# Patient Record
Sex: Male | Born: 2000 | Hispanic: No | Marital: Single | Smoking: Never smoker
Health system: Southern US, Community
[De-identification: ages and names within clinical notes are randomized; demographics above are authoritative.]

## PROBLEM LIST (undated history)

## (undated) DIAGNOSIS — R7303 Prediabetes: Secondary | ICD-10-CM

## (undated) DIAGNOSIS — I10 Essential (primary) hypertension: Secondary | ICD-10-CM

---
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
## 2020-07-04 ENCOUNTER — Emergency Department (HOSPITAL_COMMUNITY): Payer: BC Managed Care – PPO

## 2020-07-04 ENCOUNTER — Emergency Department (HOSPITAL_COMMUNITY)
Admission: EM | Admit: 2020-07-04 | Discharge: 2020-07-04 | Disposition: A | Discharge status: Home / Self Care | Payer: BC Managed Care – PPO | Attending: Emergency Medicine | Admitting: Emergency Medicine

## 2020-07-04 ENCOUNTER — Encounter: Payer: Self-pay | Admitting: Emergency Medicine

## 2020-07-04 ENCOUNTER — Other Ambulatory Visit: Payer: Self-pay

## 2020-07-04 ENCOUNTER — Ambulatory Visit (INDEPENDENT_AMBULATORY_CARE_PROVIDER_SITE_OTHER)
Admission: EM | Admit: 2020-07-04 | Discharge: 2020-07-04 | Disposition: A | Discharge status: Home / Self Care | Payer: BC Managed Care – PPO | Source: Home / Self Care

## 2020-07-04 DIAGNOSIS — D72829 Elevated white blood cell count, unspecified: Secondary | ICD-10-CM | POA: Insufficient documentation

## 2020-07-04 DIAGNOSIS — R0789 Other chest pain: Secondary | ICD-10-CM | POA: Diagnosis not present

## 2020-07-04 DIAGNOSIS — J9811 Atelectasis: Secondary | ICD-10-CM | POA: Diagnosis not present

## 2020-07-04 DIAGNOSIS — R079 Chest pain, unspecified: Secondary | ICD-10-CM

## 2020-07-04 DIAGNOSIS — I10 Essential (primary) hypertension: Secondary | ICD-10-CM | POA: Diagnosis not present

## 2020-07-04 DIAGNOSIS — R0602 Shortness of breath: Secondary | ICD-10-CM | POA: Diagnosis not present

## 2020-07-04 DIAGNOSIS — R42 Dizziness and giddiness: Secondary | ICD-10-CM | POA: Insufficient documentation

## 2020-07-04 HISTORY — DX: Essential (primary) hypertension: I10

## 2020-07-04 HISTORY — DX: Prediabetes: R73.03

## 2020-07-04 LAB — BASIC METABOLIC PANEL
Anion gap: 7 (ref 5–15)
BUN: 14 mg/dL (ref 6–20)
CO2: 28 mmol/L (ref 22–32)
Calcium: 9.6 mg/dL (ref 8.9–10.3)
Chloride: 101 mmol/L (ref 98–111)
Creatinine, Ser: 0.72 mg/dL (ref 0.61–1.24)
GFR, Estimated: >60 mL/min (ref >60)
Glucose, Bld: 99 mg/dL (ref 70–99)
Potassium: 3.8 mmol/L (ref 3.5–5.1)
Sodium: 136 mmol/L (ref 135–145)

## 2020-07-04 LAB — CBC
HCT: 45.7 % (ref 39.0–52.0)
Hemoglobin: 14.7 g/dL (ref 13.0–17.0)
MCH: 28.2 pg (ref 26.0–34.0)
MCHC: 32.2 g/dL (ref 30.0–36.0)
MCV: 87.5 fL (ref 80.0–100.0)
Platelets: 275 10*3/uL (ref 150–400)
RBC: 5.22 MIL/uL (ref 4.22–5.81)
RDW: 12.4 % (ref 11.5–15.5)
WBC: 15.3 10*3/uL — ABNORMAL HIGH (ref 4.0–10.5)
nRBC: 0 % (ref 0.0–0.2)

## 2020-07-04 LAB — TROPONIN I (HIGH SENSITIVITY): Troponin I (High Sensitivity): 5 ng/L (ref <18)

## 2020-07-04 NOTE — Discharge Instructions (Addendum)
Follow-up with your primary care doctor.  Take Tylenol or Motrin for pain control.  If you have worsening chest pain, difficulty in breathing or other new concerning symptom I would recommend coming back to ER for reassessment.

## 2020-07-04 NOTE — ED Provider Notes (Signed)
Emergency Medicine Provider Triage Evaluation Note  Donald Dodson , a 20 y.o. male  was evaluated in triage.  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 Nausea without vomiting. CP left of sternum, does not radiate. History of prediabetes, HTN (ran out of BP meds 2-3 years ago).  Review of Systems  Positive: CP, SHOB Negative: vomiting  Physical Exam  There were no vitals taken for this visit. Gen:   Awake, no distress   Resp:  Normal effort  MSK:   Moves extremities without difficulty  Other:    Medical Decision Making  Medically screening exam initiated at 2:29 PM.  Appropriate orders placed.  Kimberly Altamirano-Garcia was informed that the remainder of the evaluation will be completed by another provider, this initial triage assessment does not replace that evaluation, and the importance of remaining in the ED until their evaluation is complete.     Jeannie Fend, PA-C 07/04/20 1431    Bethann Berkshire, MD 07/04/20 [phone]

## 2020-07-04 NOTE — ED Provider Notes (Signed)
[facility] EMERGENCY DEPARTMENT Provider Note   CSN: [id number] Arrival date & time: 07/04/20  1423     History Chief Complaint  Patient presents with  . Chest Pain    Donald Dodson is a 20 y.o. male.  Presents to ER with concern for episode of chest pain.  Patient reports that Saturday afternoon while at work he noted an episode of chest pain, lasted for couple hours and then resolved.  Had another episode yesterday and then again today.  Pain up to moderate in severity, central, nonradiating.  No pain at present.  Reports he has a history of prediabetes, hypertension but does not take any medication at present.  Non-smoker.  HPI     Past Medical History:  Diagnosis Date  . Hypertension   . Pre-diabetes   . Prediabetes     There are no problems to display for this patient.   No past surgical history on file.     Family History  Problem Relation Age of Onset  . Healthy Mother     Social History   Tobacco Use  . Smoking status: Never Smoker  . Smokeless tobacco: Never Used  Vaping Use  . Vaping Use: Never used  Substance Use Topics  . Alcohol use: Never  . Drug use: Never    Home Medications Prior to Admission medications   Medication Sig Start Date End Date Taking? Authorizing Provider  ibuprofen (ADVIL,MOTRIN) 600 MG tablet Take 1 tablet (600 mg total) by mouth every 6 (six) hours as needed. 07/30/16   Lowanda Foster, NP    Allergies    Patient has no known allergies.  Review of Systems   Review of Systems  Constitutional: Negative for chills and fever.  HENT: Negative for ear pain and sore throat.   Eyes: Negative for pain and visual disturbance.  Respiratory: Negative for cough and shortness of breath.   Cardiovascular: Positive for chest pain. Negative for palpitations.  Gastrointestinal: Negative for abdominal pain and vomiting.  Genitourinary: Negative for dysuria and hematuria.  Musculoskeletal: Negative for  arthralgias and back pain.  Skin: Negative for color change and rash.  Neurological: Negative for seizures and syncope.  All other systems reviewed and are negative.   Physical Exam Updated Vital Signs BP 109/60   Pulse 73   Temp 98.5 F (36.9 C) (Oral)   Resp (!) 26   Ht 5\' 8"  (1.727 m)   Wt 102.5 kg   SpO2 98%   BMI 34.36 kg/m   Physical Exam Vitals and nursing note reviewed.  Constitutional:      Appearance: He is well-developed.  HENT:     Head: Normocephalic and atraumatic.  Eyes:     Conjunctiva/sclera: Conjunctivae normal.  Cardiovascular:     Rate and Rhythm: Normal rate and regular rhythm.     Heart sounds: No murmur heard.   Pulmonary:     Effort: Pulmonary effort is normal. No respiratory distress.     Breath sounds: Normal breath sounds.  Abdominal:     Palpations: Abdomen is soft.     Tenderness: There is no abdominal tenderness.  Musculoskeletal:     Cervical back: Neck supple.  Skin:    General: Skin is warm and dry.  Neurological:     General: No focal deficit present.     Mental Status: He is alert.     ED Results / Procedures / Treatments   Labs (all labs ordered are listed, but only abnormal results are  displayed) Labs Reviewed  CBC - Abnormal; Notable for the following components:      Result Value   WBC 15.3 (*)    All other components within normal limits  BASIC METABOLIC PANEL  TROPONIN I (HIGH SENSITIVITY)  TROPONIN I (HIGH SENSITIVITY)    EKG EKG Interpretation  Date/Time:  Monday Jul 04 2020 14:39:55 EDT Ventricular Rate:  98 PR Interval:  150 QRS Duration: 98 QT Interval:  344 QTC Calculation: 439 R Axis:   51 Text Interpretation: Normal sinus rhythm Incomplete right bundle branch block Possible Anterior infarct , age undetermined Abnormal ECG Confirmed by Marianna Fuss ([id number]) on 07/04/2020 6:54:51 PM   Radiology DG Chest 2 View  Result Date: 07/04/2020 CLINICAL DATA:  Chest pain and shortness of breath. EXAM:  CHEST - 2 VIEW COMPARISON:  None. FINDINGS: The cardiomediastinal contours are normal. Subsegmental opacity at the left lung base. Pulmonary vasculature is normal. No confluent consolidation, pleural effusion, or pneumothorax. No acute osseous abnormalities are seen. IMPRESSION: Subsegmental opacity at the left lung base favors atelectasis. Electronically Signed   By: Narda Rutherford M.D.   On: 07/04/2020 15:16    Procedures Procedures   Medications Ordered in ED Medications - No data to display  ED Course  I have reviewed the triage vital signs and the nursing notes.  Pertinent labs & imaging results that were available during my care of the patient were reviewed by me and considered in my medical decision making (see chart for details).    MDM Rules/Calculators/A&P                         20 year old male presents to ER with concern for periodic episodes of chest pain.  On exam, well-appearing, no ongoing symptoms.  EKG without acute ischemic change, troponin within normal limits, doubt ACS.  CXR negative for acute process.  Basic labs are stable except mild leukocytosis. No fever, cough or other specific infectious symptoms.  Recommend follow-up with primary doctor, okay for discharge given reassuring work-up today.   After the discussed management above, the patient was determined to be safe for discharge.  The patient was in agreement with this plan and all questions regarding their care were answered.  ED return precautions were discussed and the patient will return to the ED with any significant worsening of condition.   Final Clinical Impression(s) / ED Diagnoses Final diagnoses:  Chest pain, unspecified type    Rx / DC Orders ED Discharge Orders    None       Milagros Loll, MD 07/05/20 2349

## 2020-07-04 NOTE — ED Triage Notes (Signed)
Complains of dizziness, hands were cold and numb on Saturday.  States this lasted about an hour.  Resolved after sitting down.  No further episodes.  Patient reports feeling light headed and jittery today. Pain in center chest "like someone put weight on it".

## 2020-07-04 NOTE — ED Notes (Signed)
Patient is being discharged from the Urgent Care and sent to the Emergency Department via POV . Per Wendee Beavers, NP, patient is in need of higher level of care due to chest pain complaint. Patient is aware and verbalizes understanding of plan of care.  Vitals:   07/04/20 1004  BP: 130/73  Pulse: 85  Resp: 20  Temp: 99.5 F (37.5 C)  SpO2: 98%

## 2020-07-04 NOTE — Discharge Instructions (Addendum)
Go to the emergency department for evaluation of your chest pain and lightheadedness.

## 2020-07-04 NOTE — ED Triage Notes (Signed)
Pt presents to the ED with chest pain and dizziness since Saturday. Pt denies radiation of chest pain. Hx of pre diabetes.

## 2020-07-04 NOTE — ED Provider Notes (Signed)
Donald Dodson    CSN: [id number] Arrival date & time: 07/04/20  0920      History   Chief Complaint Chief Complaint  Patient presents with  . Hypertension    HPI Donald Dodson is a 20 y.o. male.   Patient presents with chest pain and lightheadedness.  The chest pain started on 07/02/2020, is intermittent, feels like heaviness, like something is sitting on his chest.  The chest pain comes when he is feeling nervous or anxious.  He also reports an episode of dizziness and cold hands for 1 hour on 07/02/2020.  He denies diaphoresis, nausea, vomiting, shortness of breath, focal weakness, numbness, or other symptoms.  No treatments attempted at home.  He states he used to be on blood pressure medication but stopped taking it; he does not know the name.  His medical history includes hypertension and prediabetes.  The history is provided by the patient.    Past Medical History:  Diagnosis Date  . Hypertension   . Pre-diabetes   . Prediabetes     There are no problems to display for this patient.   History reviewed. No pertinent surgical history.     Home Medications    Prior to Admission medications   Medication Sig Start Date End Date Taking? Authorizing Provider  ibuprofen (ADVIL,MOTRIN) 600 MG tablet Take 1 tablet (600 mg total) by mouth every 6 (six) hours as needed. 07/30/16   Lowanda Foster, NP    Family History Family History  Problem Relation Age of Onset  . Healthy Mother     Social History Social History   Tobacco Use  . Smoking status: Never Smoker  . Smokeless tobacco: Never Used  Vaping Use  . Vaping Use: Never used  Substance Use Topics  . Alcohol use: Never  . Drug use: Never     Allergies   Patient has no known allergies.   Review of Systems Review of Systems  Constitutional: Negative for chills, diaphoresis and fever.  HENT: Negative for ear pain and sore throat.   Respiratory: Negative for cough and shortness of breath.    Cardiovascular: Positive for chest pain. Negative for palpitations.  Gastrointestinal: Negative for abdominal pain, nausea and vomiting.  Skin: Negative for color change and rash.  Neurological: Positive for light-headedness. Negative for dizziness, tremors, seizures, syncope, facial asymmetry, speech difficulty, weakness, numbness and headaches.  Psychiatric/Behavioral: The patient is nervous/anxious.   All other systems reviewed and are negative.    Physical Exam Triage Vital Signs ED Triage Vitals  Enc Vitals Group     BP      Pulse      Resp      Temp      Temp src      SpO2      Weight      Height      Head Circumference      Peak Flow      Pain Score      Pain Loc      Pain Edu?      Excl. in GC?    No data found.  Updated Vital Signs BP 130/73 (BP Location: Left Arm) Comment (BP Location): large cuff  Pulse 85   Temp 99.5 F (37.5 C) (Oral)   Resp 20   SpO2 98%   Visual Acuity Right Eye Distance:   Left Eye Distance:   Bilateral Distance:    Right Eye Near:   Left Eye Near:    Bilateral  Near:     Physical Exam Vitals and nursing note reviewed.  Constitutional:      General: He is not in acute distress.    Appearance: He is well-developed. He is obese. He is not ill-appearing.  HENT:     Head: Normocephalic and atraumatic.     Mouth/Throat:     Mouth: Mucous membranes are moist.  Eyes:     Conjunctiva/sclera: Conjunctivae normal.  Cardiovascular:     Rate and Rhythm: Normal rate and regular rhythm.     Heart sounds: Normal heart sounds.  Pulmonary:     Effort: Pulmonary effort is normal. No respiratory distress.     Breath sounds: Normal breath sounds.  Abdominal:     Palpations: Abdomen is soft.     Tenderness: There is no abdominal tenderness.  Musculoskeletal:        General: Normal range of motion.     Cervical back: Neck supple.     Right lower leg: No edema.     Left lower leg: No edema.  Skin:    General: Skin is warm and dry.   Neurological:     General: No focal deficit present.     Mental Status: He is alert and oriented to person, place, and time.     Sensory: No sensory deficit.     Motor: No weakness.     Gait: Gait normal.  Psychiatric:        Mood and Affect: Mood normal.        Behavior: Behavior normal.      UC Treatments / Results  Labs (all labs ordered are listed, but only abnormal results are displayed) Labs Reviewed - No data to display  EKG   Radiology No results found.  Procedures Procedures (including critical care time)  Medications Ordered in UC Medications - No data to display  Initial Impression / Assessment and Plan / UC Course  I have reviewed the triage vital signs and the nursing notes.  Pertinent labs & imaging results that were available during my care of the patient were reviewed by me and considered in my medical decision making (see chart for details).   Chest pain, lightheadedness.  EKG shows sinus rhythm, rate 62, no ST elevation, compared to previous from 2018.  Based on patient's symptoms, obesity, and history of hypertension, I am concerned for possible cardiovascular event.  Sending him to the ED for evaluation.  Patient declines EMS transport.  He states he is stable to go to the ED POV; his mother is with him.   Final Clinical Impressions(s) / UC Diagnoses   Final diagnoses:  Chest pain, unspecified type  Lightheadedness     Discharge Instructions     Go to the emergency department for evaluation of your chest pain and lightheadedness.        ED Prescriptions    None     PDMP not reviewed this encounter.   Mickie Bail, NP 07/04/20 1030

## 2021-01-14 ENCOUNTER — Telehealth: Payer: BC Managed Care – PPO | Admitting: Nurse Practitioner

## 2021-01-14 DIAGNOSIS — R6889 Other general symptoms and signs: Secondary | ICD-10-CM

## 2021-01-14 MED ORDER — OSELTAMIVIR PHOSPHATE 75 MG PO CAPS: 75.0000 mg | ORAL_CAPSULE | Freq: Two times a day (BID) | ORAL | 0 refills | Status: AC

## 2021-01-14 MED ORDER — IBUPROFEN 600 MG PO TABS: 600.0000 mg | ORAL_TABLET | Freq: Four times a day (QID) | ORAL | 0 refills | Status: DC | PRN

## 2021-01-14 NOTE — Progress Notes (Signed)
Virtual Visit Consent   Donald Dodson, you are scheduled for a virtual visit with a [facility] provider today.     Just as with appointments in the office, your consent must be obtained to participate.  Your consent will be active for this visit and any virtual visit you may have with one of our providers in the next 365 days.     If you have a MyChart account, a copy of this consent can be sent to you electronically.  All virtual visits are billed to your insurance company just like a traditional visit in the office.    As this is a virtual visit, video technology does not allow for your provider to perform a traditional examination.  This may limit your provider's ability to fully assess your condition.  If your provider identifies any concerns that need to be evaluated in person or the need to arrange testing (such as labs, EKG, etc.), we will make arrangements to do so.     Although advances in technology are sophisticated, we cannot ensure that it will always work on either your end or our end.  If the connection with a video visit is poor, the visit may have to be switched to a telephone visit.  With either a video or telephone visit, we are not always able to ensure that we have a secure connection.     I need to obtain your verbal consent now.   Are you willing to proceed with your visit today?    Donald Dodson has provided verbal consent on 01/14/2021 for a virtual visit (video or telephone).   Donald Rigg, NP   Date: 01/14/2021 11:38 AM   Virtual Visit via Video Note   I, Donald Dodson, connected with  Donald Dodson  ([id number], 06-Dec-2000) on 01/14/21 at 11:30 AM EST by a video-enabled telemedicine application and verified that I am speaking with the correct person using two identifiers.  Location: Patient: Virtual Visit Location Patient: Home Provider: Virtual Visit Location Provider: Home Office   I discussed the limitations of  evaluation and management by telemedicine and the availability of in person appointments. The patient expressed understanding and agreed to proceed.    History of Present Illness: Donald Dodson is a 20 y.o. who identifies as a male who was assigned male at birth, and is being seen today for FLU LIKE SYMPTOMS.  HPI:  Sister was diagnosed with the flu last week. He is currently experiencing symptoms of fatigue, body aches, sore throat, cold and congestion over the past 3 days. Taking tylenol for body aches with no relief of pain   Problems: There are no problems to display for this patient.   Allergies: No Known Allergies Medications:  Current Outpatient Medications:    oseltamivir (TAMIFLU) 75 MG capsule, Take 1 capsule (75 mg total) by mouth 2 (two) times daily for 5 days., Disp: 10 capsule, Rfl: 0   ibuprofen (ADVIL) 600 MG tablet, Take 1 tablet (600 mg total) by mouth every 6 (six) hours as needed., Disp: 60 tablet, Rfl: 0  Observations/Objective: Patient is well-developed, well-nourished in no acute distress.  Resting comfortably at home.  Head is normocephalic, atraumatic.  No labored breathing.  Speech is clear and coherent with logical content.  Patient is alert and oriented at baseline.    Assessment and Plan: 1. Flu-like symptoms - oseltamivir (TAMIFLU) 75 MG capsule; Take 1 capsule (75 mg total) by mouth 2 (two) times daily for 5 days.  Dispense:  10 capsule; Refill: 0 - ibuprofen (ADVIL) 600 MG tablet; Take 1 tablet (600 mg total) by mouth every 6 (six) hours as needed.  Dispense: 60 tablet; Refill: 0   Follow Up Instructions: I discussed the assessment and treatment plan with the patient. The patient was provided an opportunity to ask questions and all were answered. The patient agreed with the plan and demonstrated an understanding of the instructions.  A copy of instructions were sent to the patient via MyChart unless otherwise noted below.     The patient  was advised to call back or seek an in-person evaluation if the symptoms worsen or if the condition fails to improve as anticipated.  Time:  I spent 10 minutes with the patient via telehealth technology discussing the above problems/concerns.    Donald Rigg, NP

## 2021-01-14 NOTE — Patient Instructions (Signed)
  Donald Dodson, thank you for joining Donald Rigg, NP for today's virtual visit.  While this provider is not your primary care provider (PCP), if your PCP is located in our provider database this encounter information will be shared with them immediately following your visit.  Consent: (Patient) Donald Dodson provided verbal consent for this virtual visit at the beginning of the encounter.  Current Medications:  Current Outpatient Medications:    oseltamivir (TAMIFLU) 75 MG capsule, Take 1 capsule (75 mg total) by mouth 2 (two) times daily for 5 days., Disp: 10 capsule, Rfl: 0   ibuprofen (ADVIL) 600 MG tablet, Take 1 tablet (600 mg total) by mouth every 6 (six) hours as needed., Disp: 60 tablet, Rfl: 0   Medications ordered in this encounter:  Meds ordered this encounter  Medications   oseltamivir (TAMIFLU) 75 MG capsule    Sig: Take 1 capsule (75 mg total) by mouth 2 (two) times daily for 5 days.    Dispense:  10 capsule    Refill:  0    Order Specific Question:   Supervising Provider    Answer:   MILLER, BRIAN [[id number]]   ibuprofen (ADVIL) 600 MG tablet    Sig: Take 1 tablet (600 mg total) by mouth every 6 (six) hours as needed.    Dispense:  60 tablet    Refill:  0    Order Specific Question:   Supervising Provider    Answer:   Hyacinth Meeker, BRIAN [[id number]]     *If you need refills on other medications prior to your next appointment, please contact your pharmacy*  Follow-Up: Call back or seek an in-person evaluation if the symptoms worsen or if the condition fails to improve as anticipated.  Other Instructions Please keep well-hydrated and get plenty of rest. Start a saline nasal rinse to flush out your nasal passages. You can use plain Mucinex to help thin congestion. If you have a humidifier, running in the bedroom at night. I want you to start OTC vitamin D3 1000 units daily, vitamin C 1000 mg daily, and a zinc supplement. Please take prescribed  medications as directed.   If you note any worsening of symptoms, any significant shortness of breath or any chest pain, please seek ER evaluation ASAP.  Please do not delay care!    If you have been instructed to have an in-person evaluation today at a local Urgent Care facility, please use the link below. It will take you to a list of all of our available [facility], including address, phone number and hours of operation. Please do not delay care.  [facility]  If you or a family member do not have a primary care provider, use the link below to schedule a visit and establish care. When you choose a [facility] primary care physician or advanced practice provider, you gain a long-term partner in health. Find a Primary Care Provider  Learn more about [facility]'s in-office and virtual care options: [facility] - Get Care Now

## 2021-01-15 ENCOUNTER — Ambulatory Visit: Payer: BC Managed Care – PPO

## 2021-04-03 ENCOUNTER — Other Ambulatory Visit: Payer: Self-pay | Admitting: Family Medicine

## 2021-04-03 DIAGNOSIS — R1031 Right lower quadrant pain: Secondary | ICD-10-CM

## 2021-07-11 ENCOUNTER — Ambulatory Visit
Admission: RE | Admit: 2021-07-11 | Discharge: 2021-07-11 | Disposition: A | Discharge status: Home / Self Care | Payer: BC Managed Care – PPO | Source: Ambulatory Visit | Attending: Emergency Medicine | Admitting: Emergency Medicine

## 2021-07-11 VITALS — BP 112/70 | HR 67 | Temp 98.9°F | Resp 18

## 2021-07-11 DIAGNOSIS — R519 Headache, unspecified: Secondary | ICD-10-CM | POA: Insufficient documentation

## 2021-07-11 DIAGNOSIS — J358 Other chronic diseases of tonsils and adenoids: Secondary | ICD-10-CM | POA: Insufficient documentation

## 2021-07-11 DIAGNOSIS — J310 Chronic rhinitis: Secondary | ICD-10-CM | POA: Diagnosis not present

## 2021-07-11 DIAGNOSIS — H6691 Otitis media, unspecified, right ear: Secondary | ICD-10-CM | POA: Insufficient documentation

## 2021-07-11 DIAGNOSIS — J039 Acute tonsillitis, unspecified: Secondary | ICD-10-CM | POA: Diagnosis not present

## 2021-07-11 DIAGNOSIS — J329 Chronic sinusitis, unspecified: Secondary | ICD-10-CM | POA: Diagnosis not present

## 2021-07-11 LAB — POCT RAPID STREP A (OFFICE): Rapid Strep A Screen: NEGATIVE

## 2021-07-11 MED ORDER — CEFDINIR 300 MG PO CAPS: 300.0000 mg | ORAL_CAPSULE | Freq: Two times a day (BID) | ORAL | 0 refills | Status: AC

## 2021-07-11 MED ORDER — FLUTICASONE PROPIONATE 50 MCG/ACT NA SUSP: 1.0000 | Freq: Every day | NASAL | 1 refills | Status: AC

## 2021-07-11 MED ORDER — FEXOFENADINE HCL 180 MG PO TABS: 180.0000 mg | ORAL_TABLET | Freq: Every day | ORAL | 1 refills | Status: AC

## 2021-07-11 MED ORDER — IBUPROFEN 600 MG PO TABS: 600.0000 mg | ORAL_TABLET | Freq: Three times a day (TID) | ORAL | 0 refills | Status: AC | PRN

## 2021-07-11 MED ORDER — KETOROLAC TROMETHAMINE 60 MG/2ML IM SOLN
60.0000 mg | Freq: Once | INTRAMUSCULAR | Status: AC
  Administered 2021-07-11: 60 mg via INTRAMUSCULAR

## 2021-07-11 NOTE — Discharge Instructions (Addendum)
You were tested for COVID-19 today.  The result of your COVID-19 test will be posted to your MyChart once it is complete, typically this takes 36 to 48 hours.  ?  ?If your COVID-19 test is positive, please advise the nurse whether you are interested in taking the recommended antiviral for COVID-19 called Paxlovid. ?  ? ?Your strep test today is negative.  Streptococcal throat culture will be performed per our protocol, please keep in mind that the rapid strep test that we perform here at urgent care only catches 40% of strep throat infections. ?  ?Based on my physical exam findings which showed that your right tonsil is enlarged, has white patches on it, your right inner ear is red and there is purulent fluid behind your eardrum, I recommend that you begin antibiotics now for presumed strep throat and left ear infection instead of waiting for the strep culture result.  I have sent a prescription to your pharmacy.  After 24 hours of antibiotics, you should begin to feel significantly better.   ?  ?After 24 hours of taking antibiotics, please discard your toothbrush as well as any other oral devices that you are currently using and replace them with new ones to avoid reinfection. ?  ?If your streptococcal throat culture has a negative result but you feel significantly better after taking antibiotics for 24 to 48 hours, I strongly recommend that you finish the full 10-day course to treat the likely infection in your right ear.   ?  ?Once you have been on antibiotics for a full 24 hours, you are no longer considered contagious.   I have provided you with a note to return to work. ?  ?Even if you are feeling better, please make sure that you finish the full 10-day course and do not skip any doses.  Failure to complete a full course of antibiotics for strep throat can result in worsening infection that may require longer treatment with stronger antibiotics. ?  ?Omnicef (cefdinir):  1 capsule twice daily for 10 days, you  can take it with or without food.  This antibiotic can cause upset stomach, this will resolve once antibiotics are complete.  You are welcome to use a probiotic, eat yogurt, take Imodium while taking this medication.  Please avoid other systemic medications such as Maalox, Pepto-Bismol or milk of magnesia as they can interfere with your body's ability to absorb the antibiotics. ?  ?It is very important that you take antibiotics as prescribed.  If you skip doses or do not complete the full course of antibiotics, you put yourself at significant risk of recurrent infection which can often be worse than your initial infection. ?  ? ?For your headache, you received an injection of ketorolac during your visit today that should significantly relieve your pain.  I also sent a prescription for over-the-counter nonsteroidal anti-inflammatory pain medication for you to take should your headache symptoms return. ? ?Advil, Motrin (ibuprofen): This is a good anti-inflammatory medication which not only addresses aches, pains but also significantly reduces soft tissue inflammation of the upper airways that causes sinus and nasal congestion as well as inflammation of the lower airways which makes you feel like your breathing is constricted or your cough feel tight.  I recommend that you take between 400 mg every 8 hours as needed.    ?  ? ?Your symptoms and my physical exam findings are also concerning for exacerbation of your underlying allergies.  It is important that you  begin your allergy regimen now and are consistent with taking allergy medications exactly as prescribed.   ? ?Allegra (fexofenadine): This is an excellent second-generation antihistamine that helps to reduce respiratory inflammatory response to environmental allergens.  This medication is not known to cause daytime sleepiness so it can be taken in the daytime.  If you find that it does make you sleepy, please feel free to take it at bedtime. ?  ?Flonase  (fluticasone): This is a steroid nasal spray that you use once daily, 1 spray in each nare.  This medication does not work well if you decide to use it only used as you feel you need to, it works best used on a daily basis.  After 3 to 5 days of use, you will notice significant reduction of the inflammation and mucus production that is currently being caused by exposure to allergens, whether seasonal or environmental.  The most common side effect of this medication is nosebleeds.  If you experience a nosebleed, please discontinue use for 1 week, then feel free to resume.  I have provided you with a prescription.   ?If your insurance will not cover your allergy medications, please consider downloading the [employer] app which is free.  You can find considerable discounts on prescription and over-the-counter medications. ?  ?If you find that you have not had significant relief of your symptoms in the next 7 to 10 days, please follow-up with your primary care provider or return here to urgent care for repeat evaluation and further recommendations. ?  ?Thank you for visiting urgent care today.  We appreciate the opportunity to participate in your care. ?  ? ?

## 2021-07-11 NOTE — ED Provider Notes (Signed)
?[facility] ? ? ? ?CSN: [id number] ?Arrival date & time: 07/11/21  6384 ?  ? ?HISTORY  ? ?Chief Complaint  ?Patient presents with  ? Headache  ?  Entered by patient  ? ?HPI ?Donald Dodson is a 21 y.o. male. Patient complains of an intractable headache that is causing pain above and below both of his eyes and across his forehead to both temples that began 2 days ago.  Patient states he took Tylenol and ibuprofen with little relief.  Patient states he also took Alka-Seltzer which helped for a few hours.  Patient denies sore throat, otalgia, allergies, congestion, runny nose, fever, aches, chills.  Patient endorses upset stomach began yesterday and he also has not had any appetite since yesterday.  Patient states he has not vomited or had any diarrhea.  Patient denies known sick contacts.  Patient denies history of allergies, asthma, history of migraines. ? ?The history is provided by the patient.  ?Past Medical History:  ?Diagnosis Date  ? Hypertension   ? Pre-diabetes   ? Prediabetes   ? ?There are no problems to display for this patient. ? ?History reviewed. No pertinent surgical history. ? ?Home Medications   ? ?Prior to Admission medications   ?Medication Sig Start Date End Date Taking? Authorizing Provider  ?ibuprofen (ADVIL) 600 MG tablet Take 1 tablet (600 mg total) by mouth every 6 (six) hours as needed. 01/14/21   Claiborne Rigg, NP  ? ?Family History ?Family History  ?Problem Relation Age of Onset  ? Healthy Mother   ? ?Social History ?Social History  ? ?Tobacco Use  ? Smoking status: Never  ? Smokeless tobacco: Never  ?Vaping Use  ? Vaping Use: Never used  ?Substance Use Topics  ? Alcohol use: Never  ? Drug use: Never  ? ?Allergies   ?Patient has no known allergies. ? ?Review of Systems ?Review of Systems ?Pertinent findings noted in history of present illness.  ? ?Physical Exam ?Triage Vital Signs ?ED Triage Vitals  ?Enc Vitals Group  ?   BP 12/23/20 0827 (!) 147/82  ?   Pulse  Rate 12/23/20 0827 72  ?   Resp 12/23/20 0827 18  ?   Temp 12/23/20 0827 98.3 ?F (36.8 ?C)  ?   Temp Source 12/23/20 0827 Oral  ?   SpO2 12/23/20 0827 98 %  ?   Weight --   ?   Height --   ?   Head Circumference --   ?   Peak Flow --   ?   Pain Score 12/23/20 0826 5  ?   Pain Loc --   ?   Pain Edu? --   ?   Excl. in GC? --   ?No data found. ? ?Updated Vital Signs ?BP 112/70 (BP Location: Right Arm)   Pulse 67   Temp 98.9 ?F (37.2 ?C) (Oral)   Resp 18   SpO2 95%  ? ?Physical Exam ?Vitals and nursing note reviewed.  ?Constitutional:   ?   General: He is not in acute distress. ?   Appearance: Normal appearance. He is well-developed. He is ill-appearing. He is not toxic-appearing.  ?HENT:  ?   Head: Normocephalic and atraumatic.  ?   Salivary Glands: Right salivary gland is diffusely enlarged and tender. Left salivary gland is diffusely enlarged and tender.  ?   Right Ear: Hearing and external ear normal. No drainage. A middle ear effusion (Milky) is present. There is no impacted cerumen. Tympanic membrane  is injected, erythematous and bulging.  ?   Left Ear: Hearing, ear canal and external ear normal. No drainage. A middle ear effusion is present. There is no impacted cerumen. Tympanic membrane is bulging. Tympanic membrane is not injected or erythematous.  ?   Ears:  ?   Comments: Left EAC normal, left TM bulging with clear fluid.  Right EAC diffusely erythematous with mild edema, right TM erythematous and injected and bulging with milky fluid ?   Nose: Mucosal edema, congestion and rhinorrhea present. No nasal deformity, septal deviation, signs of injury or nasal tenderness. Rhinorrhea is clear.  ?   Right Nostril: Occlusion present. No foreign body, epistaxis or septal hematoma.  ?   Left Nostril: Occlusion present. No foreign body, epistaxis or septal hematoma.  ?   Right Turbinates: Enlarged, swollen and pale.  ?   Left Turbinates: Enlarged, swollen and pale.  ?   Right Sinus: No maxillary sinus tenderness or  frontal sinus tenderness.  ?   Left Sinus: No maxillary sinus tenderness or frontal sinus tenderness.  ?   Mouth/Throat:  ?   Lips: Pink. No lesions.  ?   Mouth: Mucous membranes are moist. No oral lesions or angioedema.  ?   Dentition: Normal dentition. No gingival swelling.  ?   Tongue: No lesions.  ?   Palate: No mass.  ?   Pharynx: Uvula midline. Pharyngeal swelling, oropharyngeal exudate and posterior oropharyngeal erythema present. No uvula swelling.  ?   Tonsils: Tonsillar exudate present. 2+ on the right. 4+ on the left.  ?   Comments: Postnasal drip.  Exudate on left tonsil only which is significantly more enlarged than the right. ?Eyes:  ?   General: Lids are normal.     ?   Right eye: No discharge.     ?   Left eye: No discharge.  ?   Extraocular Movements: Extraocular movements intact.  ?   Conjunctiva/sclera: Conjunctivae normal.  ?   Right eye: Right conjunctiva is not injected.  ?   Left eye: Left conjunctiva is not injected.  ?   Pupils: Pupils are equal, round, and reactive to light.  ?Neck:  ?   Thyroid: No thyroid mass, thyromegaly or thyroid tenderness.  ?   Trachea: Phonation normal. Tracheal tenderness present. No abnormal tracheal secretions or tracheal deviation.  ?   Comments: Voice is muffled ?Cardiovascular:  ?   Rate and Rhythm: Normal rate and regular rhythm.  ?   Pulses: Normal pulses.  ?   Heart sounds: Normal heart sounds, S1 normal and S2 normal. No murmur heard. ?  No friction rub. No gallop.  ?Pulmonary:  ?   Effort: Pulmonary effort is normal. No accessory muscle usage, prolonged expiration, respiratory distress or retractions.  ?   Breath sounds: Normal breath sounds. No stridor, decreased air movement or transmitted upper airway sounds. No decreased breath sounds, wheezing, rhonchi or rales.  ?Chest:  ?   Chest wall: No tenderness.  ?Abdominal:  ?   General: Bowel sounds are normal.  ?   Palpations: Abdomen is soft.  ?   Tenderness: There is generalized abdominal tenderness.  There is no right CVA tenderness, left CVA tenderness or rebound. Negative signs include Murphy's sign.  ?   Hernia: No hernia is present.  ?Musculoskeletal:     ?   General: No tenderness. Normal range of motion.  ?   Cervical back: Full passive range of motion without pain, normal range of motion  and neck supple. Normal range of motion.  ?   Right lower leg: No edema.  ?   Left lower leg: No edema.  ?Lymphadenopathy:  ?   Cervical: Cervical adenopathy present.  ?   Right cervical: Superficial cervical adenopathy and posterior cervical adenopathy present.  ?   Left cervical: Superficial cervical adenopathy and posterior cervical adenopathy present.  ?Skin: ?   General: Skin is warm and dry.  ?   Findings: No erythema, lesion or rash.  ?Neurological:  ?   General: No focal deficit present.  ?   Mental Status: He is alert and oriented to person, place, and time. Mental status is at baseline.  ?Psychiatric:     ?   Mood and Affect: Mood normal.     ?   Behavior: Behavior normal.     ?   Thought Content: Thought content normal.     ?   Judgment: Judgment normal.  ? ? ?Visual Acuity ?Right Eye Distance:   ?Left Eye Distance:   ?Bilateral Distance:   ? ?Right Eye Near:   ?Left Eye Near:    ?Bilateral Near:    ? ?UC Couse / Diagnostics / Procedures:  ?  ?EKG ? ?Radiology ?No results found. ? ?Procedures ?Procedures (including critical care time) ? ?UC Diagnoses / Final Clinical Impressions(s)   ?I have reviewed the triage vital signs and the nursing notes. ? ?Pertinent labs & imaging results that were available during my care of the patient were reviewed by me and considered in my medical decision making (see chart for details).   ?Final diagnoses:  ?Acute tonsillitis, unspecified etiology  ?Tonsillar exudate  ?Acute intractable headache, unspecified headache type  ?Infective otitis media, right  ?Rhinosinusitis  ? ?Rapid strep test today is negative.  Patient will be provided with antibiotics to treat the likely  infection in his right ear.  Throat culture pending.  COVID-19 test is pending, will treat with Paxlovid if positive. ? ?Patient provided with allergy medications and advised to take through June of this year.  Return

## 2021-07-11 NOTE — ED Triage Notes (Signed)
Pt c/o migraine that is causing facial pain, he states tylenol has not helped but the use of Alka seltzer was helpful for a short period of time. ?Started: Sunday night  ?

## 2021-07-12 LAB — NOVEL CORONAVIRUS, NAA: SARS-CoV-2, NAA: NOT DETECTED

## 2021-07-13 LAB — CULTURE, GROUP A STREP (THRC): Special Requests: NORMAL

## 2021-09-08 ENCOUNTER — Emergency Department (HOSPITAL_COMMUNITY): Payer: BC Managed Care – PPO

## 2021-09-08 ENCOUNTER — Encounter (HOSPITAL_COMMUNITY): Payer: Self-pay

## 2021-09-08 ENCOUNTER — Emergency Department (HOSPITAL_COMMUNITY)
Admission: EM | Admit: 2021-09-08 | Discharge: 2021-09-08 | Disposition: A | Discharge status: Home / Self Care | Payer: BC Managed Care – PPO | Attending: Emergency Medicine | Admitting: Emergency Medicine

## 2021-09-08 ENCOUNTER — Other Ambulatory Visit: Payer: Self-pay

## 2021-09-08 DIAGNOSIS — R0602 Shortness of breath: Secondary | ICD-10-CM | POA: Insufficient documentation

## 2021-09-08 DIAGNOSIS — R200 Anesthesia of skin: Secondary | ICD-10-CM | POA: Diagnosis not present

## 2021-09-08 DIAGNOSIS — R202 Paresthesia of skin: Secondary | ICD-10-CM | POA: Insufficient documentation

## 2021-09-08 DIAGNOSIS — R079 Chest pain, unspecified: Secondary | ICD-10-CM

## 2021-09-08 DIAGNOSIS — R0789 Other chest pain: Secondary | ICD-10-CM | POA: Insufficient documentation

## 2021-09-08 LAB — BASIC METABOLIC PANEL
Anion gap: 14 (ref 5–15)
BUN: 15 mg/dL (ref 6–20)
CO2: 25 mmol/L (ref 22–32)
Calcium: 9.4 mg/dL (ref 8.9–10.3)
Chloride: 103 mmol/L (ref 98–111)
Creatinine, Ser: 0.93 mg/dL (ref 0.61–1.24)
GFR, Estimated: >60 mL/min (ref >60)
Glucose, Bld: 128 mg/dL — ABNORMAL HIGH (ref 70–99)
Potassium: 3.7 mmol/L (ref 3.5–5.1)
Sodium: 142 mmol/L (ref 135–145)

## 2021-09-08 LAB — CBC
HCT: 42.7 % (ref 39.0–52.0)
Hemoglobin: 14.2 g/dL (ref 13.0–17.0)
MCH: 29.2 pg (ref 26.0–34.0)
MCHC: 33.3 g/dL (ref 30.0–36.0)
MCV: 87.7 fL (ref 80.0–100.0)
Platelets: 217 10*3/uL (ref 150–400)
RBC: 4.87 MIL/uL (ref 4.22–5.81)
RDW: 12.5 % (ref 11.5–15.5)
WBC: 8.5 10*3/uL (ref 4.0–10.5)
nRBC: 0 % (ref 0.0–0.2)

## 2021-09-08 LAB — TROPONIN I (HIGH SENSITIVITY)
Troponin I (High Sensitivity): 7 ng/L (ref <18)
Troponin I (High Sensitivity): 7 ng/L (ref <18)

## 2021-09-08 NOTE — ED Provider Notes (Signed)
[facility] EMERGENCY DEPARTMENT Provider Note   CSN: [id number] Arrival date & time: 09/08/21  0855     History PMH: HTN, Pre diabetes Chief Complaint  Patient presents with   arm numbness    Donald Dodson is a 21 y.o. male. Pt complains of right arm numbness.  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  He says he suddenly started noticing some middle of his chest discomfort with some mild shortness of breath, and associated right arm numbness from the shoulder down to his hands.  His symptoms have started to improve, but have not completely gone away.  He says he has some associated tingling in his right upper extremity. He has no neck pain, shoulder pain, headaches, visual disturbance, speech change, motor weakness, gait abnormality, leg swelling.  HPI     Home Medications Prior to Admission medications   Medication Sig Start Date End Date Taking? Authorizing Provider  fexofenadine (ALLEGRA) 180 MG tablet Take 1 tablet (180 mg total) by mouth daily. 07/11/21 01/07/22  Theadora Rama Scales, PA-C  fluticasone (FLONASE) 50 MCG/ACT nasal spray Place 1 spray into both nostrils daily. Begin by using 2 sprays in each nare daily for 3 to 5 days, then decrease to 1 spray in each nare daily. 07/11/21   Theadora Rama Scales, PA-C  ibuprofen (ADVIL) 600 MG tablet Take 1 tablet (600 mg total) by mouth every 8 (eight) hours as needed for up to 30 doses for fever, headache, mild pain or moderate pain (Inflammation). Take 1 tablet 3 times daily as needed for inflammation of upper airways and/or pain. 07/11/21   Theadora Rama Scales, PA-C      Allergies    Patient has no known allergies.    Review of Systems   Review of Systems  Respiratory:  Positive for shortness of breath.   Cardiovascular:  Positive for chest pain.  Neurological:  Positive for numbness.   All other systems reviewed and are negative.   Physical Exam Updated Vital Signs BP 112/66 (BP Location: Right Arm)   Pulse 64   Temp 98.3 F (36.8 C) (Oral)   Resp 15   Ht 5\' 7"  (1.702 m)   Wt 99.8 kg   SpO2 100%   BMI 34.46 kg/m  Physical Exam Vitals and nursing note reviewed.  Constitutional:      General: He is not in acute distress.    Appearance: Normal appearance. He is well-developed. He is not ill-appearing, toxic-appearing or diaphoretic.  HENT:     Head: Normocephalic and atraumatic.     Nose: No nasal deformity.     Mouth/Throat:     Lips: Pink. No lesions.     Mouth: Mucous membranes are moist. No injury, lacerations, oral lesions or angioedema.     Pharynx: Oropharynx is clear. Uvula midline. No pharyngeal swelling, oropharyngeal exudate, posterior oropharyngeal erythema or uvula swelling.  Eyes:     General: Gaze aligned appropriately. No scleral icterus.       Right eye: No discharge.        Left eye: No discharge.     Conjunctiva/sclera: Conjunctivae normal.     Right eye: Right conjunctiva is not injected. No exudate or hemorrhage.    Left eye: Left conjunctiva is not injected. No exudate or hemorrhage.    Pupils: Pupils are equal, round, and reactive to light.  Cardiovascular:  Rate and Rhythm: Normal rate and regular rhythm.     Pulses: Normal pulses.          Radial pulses are 2+ on the right side and 2+ on the left side.       Dorsalis pedis pulses are 2+ on the right side and 2+ on the left side.     Heart sounds: Normal heart sounds, S1 normal and S2 normal. Heart sounds not distant. No murmur heard.    No friction rub. No gallop. No S3 or S4 sounds.  Pulmonary:     Effort: Pulmonary effort is normal. No accessory muscle usage or respiratory distress.     Breath sounds: Normal breath sounds. No stridor. No wheezing, rhonchi or rales.  Chest:     Chest wall: No tenderness.  Abdominal:     General: Abdomen is flat. There is no distension.      Palpations: Abdomen is soft. There is no mass or pulsatile mass.     Tenderness: There is no abdominal tenderness. There is no guarding or rebound.  Musculoskeletal:     Right lower leg: No edema.     Left lower leg: No edema.     Comments: No reproducible shoulder or right arm pain, no reproducible chest wall pain 2+ radial pulse bilaterally. Subjective right arm decreased sensation  Skin:    General: Skin is warm and dry.     Coloration: Skin is not jaundiced or pale.     Findings: No bruising, erythema, lesion or rash.  Neurological:     General: No focal deficit present.     Mental Status: He is alert and oriented to person, place, and time.     GCS: GCS eye subscore is 4. GCS verbal subscore is 5. GCS motor subscore is 6.  Psychiatric:        Mood and Affect: Mood normal.        Speech: Speech normal.        Behavior: Behavior normal. Behavior is cooperative.     ED Results / Procedures / Treatments   Labs (all labs ordered are listed, but only abnormal results are displayed) Labs Reviewed  BASIC METABOLIC PANEL - Abnormal; Notable for the following components:      Result Value   Glucose, Bld 128 (*)    All other components within normal limits  CBC  TROPONIN I (HIGH SENSITIVITY)  TROPONIN I (HIGH SENSITIVITY)    EKG EKG Interpretation  Date/Time:  Friday September 08 2021 09:05:52 EDT Ventricular Rate:  75 PR Interval:  162 QRS Duration: 106 QT Interval:  368 QTC Calculation: 410 R Axis:   54 Text Interpretation: Sinus rhythm with marked sinus arrhythmia Otherwise normal ECG When compared with ECG of 04-Jul-2020 14:39, No significant change since last tracing Confirmed by Meridee Score [phone]) on 09/08/2021 12:54:20 PM  Radiology DG Chest 2 View  Result Date: 09/08/2021 CLINICAL DATA:  Chest pain EXAM: CHEST - 2 VIEW COMPARISON:  07/04/2020 FINDINGS: The lungs appear clear. Cardiac and mediastinal contours normal. No pleural effusion identified. IMPRESSION: No  active cardiopulmonary disease is radiographically apparent. Electronically Signed   By: Gaylyn Rong M.D.   On: 09/08/2021 09:54    Procedures Procedures  This patient was on telemetry or cardiac monitoring during their time in the ED.    Medications Ordered in ED Medications - No data to display  ED Course/ Medical Decision Making/ A&P Clinical Course as of 09/08/21 1513  Fri Sep 08, 2021  1355 Dispo pending repeat troponin [GL]    Clinical Course User Index [GL] Pluma Diniz, Finis Bud, PA-C                           Medical Decision Making Amount and/or Complexity of Data Reviewed Labs: ordered. Radiology: ordered.    MDM  This is a 21 y.o. male who presents to the ED with chest pain and left arm paresthesias The differential of this patient includes but is not limited to ACS, Aortic Dissection, PE, Pinched nerve, etc.  Initial Impression  Well appearing. Stable vitals Exam reassuring. Concern for ACS given family history of sudden death. Will get cardiac workup. Low suspicion for dissection with improvement of symptoms. PE low risk (PERC negative).   I personally ordered, reviewed, and interpreted all laboratory work and imaging and agree with radiologist interpretation. Results interpreted below: CBC normal.  BMP normal.  Troponin negative x2.  Chest x-ray without any acute abnormalities.  EKG is nonischemic and normal sinus rhythm/SA.  Assessment/Plan:  On reassessment, patient's symptoms have resolved.  He had 2 negative troponin markers.  I have low suspicion this is a cardiac etiology to his symptoms.  Suspect it is more musculoskeletal.  Patient has follow-up with his PCP in 3 days.  Recommend that he go to this appointment.  Return precautions discussed with patient.   Charting Requirements Additional history is obtained from:  Independent historian External Records from outside source obtained and reviewed including: n/a Social Determinants of Health:   none Pertinant PMH that complicates patient's illness: n/a  Patient Care Problems that were addressed during this visit: - Chest Pain: Acute illness - Paresthesias: Acute illness with systemic symptoms This patient was maintained on a cardiac monitor/telemetry. I personally viewed and interpreted the cardiac monitor which reveals an underlying rhythm of NSR Medications given in ED: n/a Reevaluation of the patient after these medicines showed that the patient resolved I have reviewed home medications and made changes accordingly.  Critical Care Interventions: n/a Consultations: n/a Disposition: discharge  Portions of this note were generated with Dragon dictation software. Dictation errors may occur despite best attempts at proofreading.    Final Clinical Impression(s) / ED Diagnoses Final diagnoses:  Chest pain, unspecified type  Paresthesias    Rx / DC Orders ED Discharge Orders     None         Claudie Leach, PA-C 09/08/21 1513    Terrilee Files, MD 09/08/21 [phone]

## 2021-09-08 NOTE — Discharge Instructions (Signed)
Please schedule a follow up appointment with your PCP to discuss further management of your symptoms.

## 2021-09-08 NOTE — ED Provider Triage Note (Signed)
Emergency Medicine Provider Triage Evaluation Note  Donald Dodson , a 21 y.o. male  was evaluated in triage.  Pt complains of right arm numbness.  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  He says he suddenly started noticing some middle of his chest discomfort with some mild shortness of breath, and associated right arm numbness from the shoulder down to his hands.  His symptoms have started to improve, but have not completely gone away.  He says he has some associated tingling in his right upper extremity. He has no neck pain, shoulder pain, headaches, visual disturbance, speech change, motor weakness, gait abnormality, leg swelling.  He does note that his father died suddenly from a possible heart condition when he was age 21 while at a party where he collapsed.  It is unknown what happened.  Review of Systems  Positive: Chest pain, shortness of breath, numbness Negative:   Physical Exam  BP (!) 152/87   Pulse 63   Temp 98.2 F (36.8 C) (Oral)   Resp 18   Ht 5\' 7"  (1.702 m)   Wt 99.8 kg   SpO2 100%   BMI 34.46 kg/m  Gen:   Awake, no distress   Resp:  Normal effort  MSK:   Moves extremities without difficulty  Other:  5 out of 5 motor strength in all extremities.  There is decreased right upper extremity sensation up until the shoulder.  No cervical spine tenderness.  Abdomen is soft and nontender.  Heart regular rate and rhythm.  Lungs CTA. He has round equal and reactive pupils.  EOM intact. Gait intact.  Medical Decision Making  Medically screening exam initiated at 9:26 AM.  Appropriate orders placed.  Donald Dodson was informed that the remainder of the evaluation will be completed by another provider, this initial triage assessment does not replace that evaluation, and the importance of remaining in the ED until their evaluation is complete.  Cardiac  Workup initiated.    Christiane Ha, PA-C 09/08/21 0930

## 2021-09-08 NOTE — ED Triage Notes (Signed)
Pt states he was at work and right arm started tingling and feeling numb.  Pt denies pain or injury.  Pt mounts lenses on glasses for work.

## 2021-10-16 DIAGNOSIS — R200 Anesthesia of skin: Secondary | ICD-10-CM | POA: Diagnosis not present

## 2021-10-16 DIAGNOSIS — F4322 Adjustment disorder with anxiety: Secondary | ICD-10-CM | POA: Diagnosis not present

## 2021-12-07 DIAGNOSIS — F39 Unspecified mood [affective] disorder: Secondary | ICD-10-CM | POA: Diagnosis not present

## 2021-12-07 DIAGNOSIS — Z0131 Encounter for examination of blood pressure with abnormal findings: Secondary | ICD-10-CM | POA: Diagnosis not present

## 2021-12-07 DIAGNOSIS — I10 Essential (primary) hypertension: Secondary | ICD-10-CM | POA: Diagnosis not present

## 2021-12-07 DIAGNOSIS — Z0130 Encounter for examination of blood pressure without abnormal findings: Secondary | ICD-10-CM | POA: Diagnosis not present

## 2021-12-07 DIAGNOSIS — F4322 Adjustment disorder with anxiety: Secondary | ICD-10-CM | POA: Diagnosis not present

## 2021-12-07 DIAGNOSIS — Z1389 Encounter for screening for other disorder: Secondary | ICD-10-CM | POA: Diagnosis not present

## 2021-12-07 DIAGNOSIS — R5383 Other fatigue: Secondary | ICD-10-CM | POA: Diagnosis not present

## 2022-01-27 DIAGNOSIS — Z8489 Family history of other specified conditions: Secondary | ICD-10-CM | POA: Diagnosis not present

## 2022-01-27 DIAGNOSIS — F1090 Alcohol use, unspecified, uncomplicated: Secondary | ICD-10-CM | POA: Diagnosis not present

## 2022-01-27 DIAGNOSIS — R079 Chest pain, unspecified: Secondary | ICD-10-CM | POA: Diagnosis not present

## 2022-01-27 DIAGNOSIS — I10 Essential (primary) hypertension: Secondary | ICD-10-CM | POA: Diagnosis not present

## 2022-01-27 DIAGNOSIS — K219 Gastro-esophageal reflux disease without esophagitis: Secondary | ICD-10-CM | POA: Diagnosis not present

## 2022-01-27 DIAGNOSIS — G5603 Carpal tunnel syndrome, bilateral upper limbs: Secondary | ICD-10-CM | POA: Diagnosis not present

## 2022-01-27 DIAGNOSIS — R0602 Shortness of breath: Secondary | ICD-10-CM | POA: Diagnosis not present

## 2022-01-27 DIAGNOSIS — R42 Dizziness and giddiness: Secondary | ICD-10-CM | POA: Diagnosis not present

## 2022-01-27 DIAGNOSIS — R0789 Other chest pain: Secondary | ICD-10-CM | POA: Diagnosis not present

## 2022-01-30 DIAGNOSIS — R079 Chest pain, unspecified: Secondary | ICD-10-CM | POA: Diagnosis not present

## 2022-02-08 DIAGNOSIS — Z23 Encounter for immunization: Secondary | ICD-10-CM | POA: Diagnosis not present

## 2022-02-08 DIAGNOSIS — R7309 Other abnormal glucose: Secondary | ICD-10-CM | POA: Diagnosis not present

## 2022-02-08 DIAGNOSIS — R0789 Other chest pain: Secondary | ICD-10-CM | POA: Diagnosis not present

## 2022-02-08 DIAGNOSIS — Z1389 Encounter for screening for other disorder: Secondary | ICD-10-CM | POA: Diagnosis not present

## 2022-02-08 DIAGNOSIS — E781 Pure hyperglyceridemia: Secondary | ICD-10-CM | POA: Diagnosis not present

## 2022-02-08 DIAGNOSIS — F411 Generalized anxiety disorder: Secondary | ICD-10-CM | POA: Diagnosis not present

## 2022-02-08 DIAGNOSIS — I10 Essential (primary) hypertension: Secondary | ICD-10-CM | POA: Diagnosis not present

## 2022-02-08 DIAGNOSIS — Z8241 Family history of sudden cardiac death: Secondary | ICD-10-CM | POA: Diagnosis not present

## 2022-02-08 DIAGNOSIS — E782 Mixed hyperlipidemia: Secondary | ICD-10-CM | POA: Diagnosis not present

## 2022-02-08 DIAGNOSIS — F5101 Primary insomnia: Secondary | ICD-10-CM | POA: Diagnosis not present

## 2022-04-04 DIAGNOSIS — Z8241 Family history of sudden cardiac death: Secondary | ICD-10-CM | POA: Diagnosis not present

## 2022-04-04 DIAGNOSIS — R0789 Other chest pain: Secondary | ICD-10-CM | POA: Diagnosis not present

## 2022-05-12 IMAGING — DX DG CHEST 2V
2 series · 2 of 2 positions shown · non-contrast
Comparison: None.

CLINICAL DATA: Chest pain and shortness of breath.

EXAM:
CHEST - 2 VIEW

[chest pa]
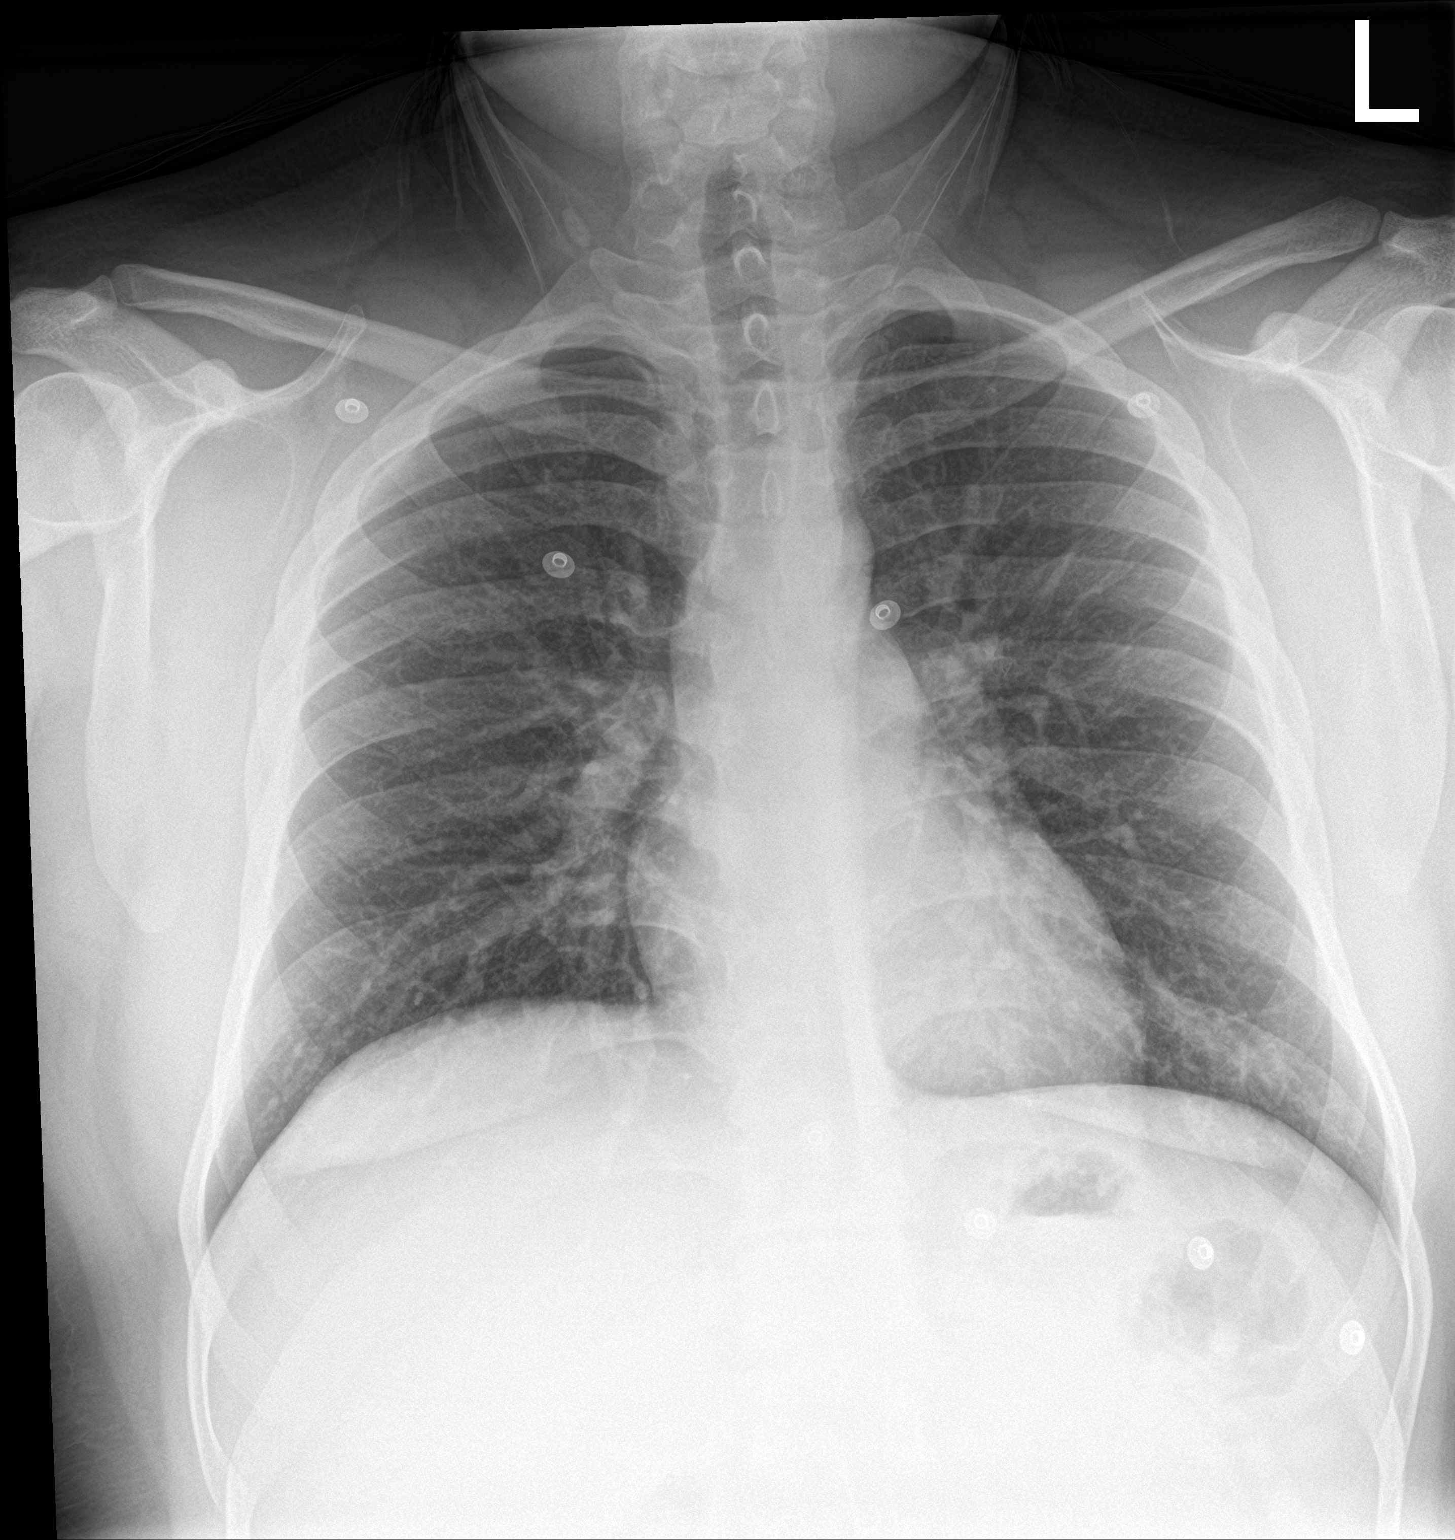

[chest lat]
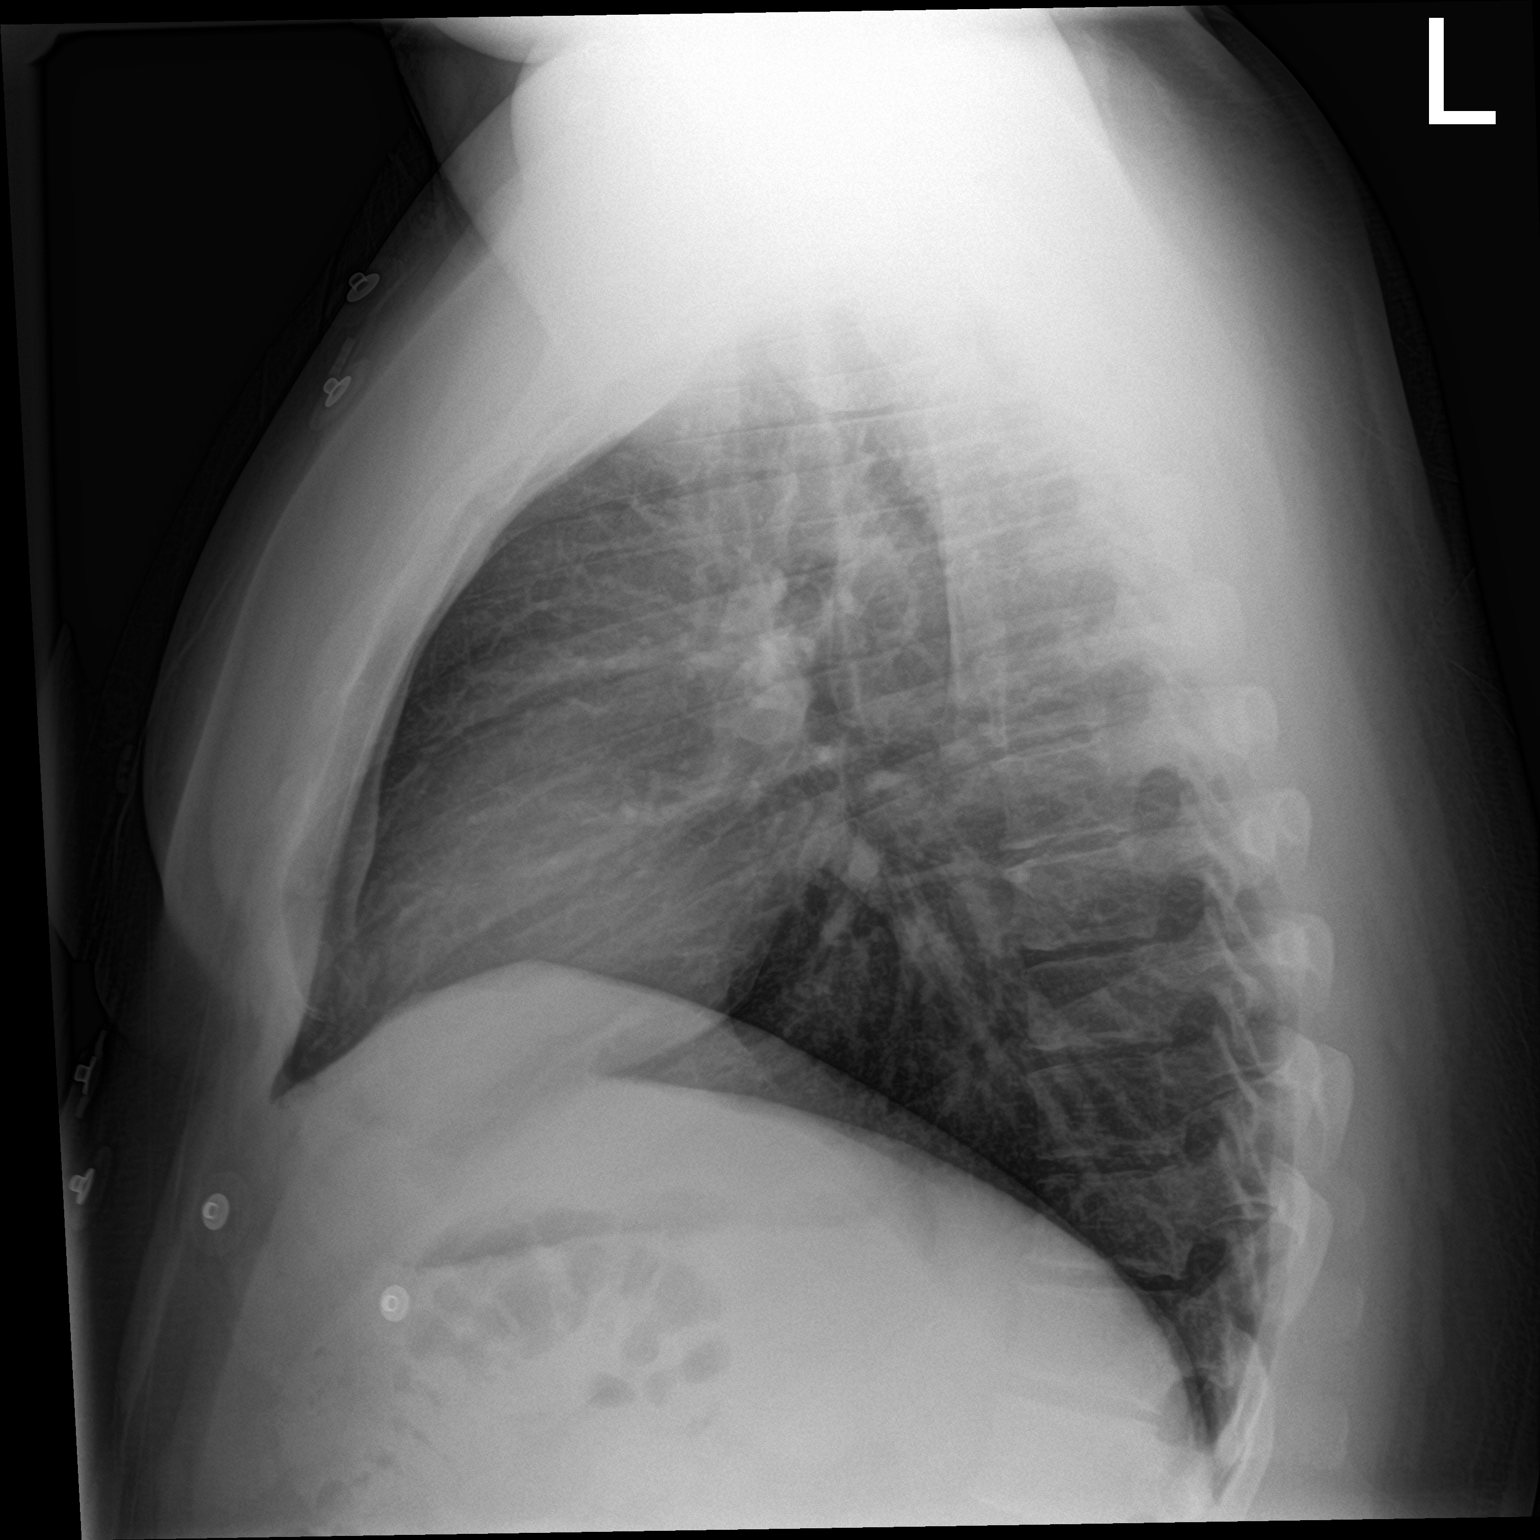

[2 of 2 positions shown; findings below may reference images not displayed]

FINDINGS: The cardiomediastinal contours are normal. Subsegmental opacity at
the left lung base. Pulmonary vasculature is normal. No confluent
consolidation, pleural effusion, or pneumothorax. No acute osseous
abnormalities are seen.
IMPRESSION: Subsegmental opacity at the left lung base favors atelectasis.

## 2022-10-18 DIAGNOSIS — E669 Obesity, unspecified: Secondary | ICD-10-CM | POA: Diagnosis not present

## 2022-10-18 DIAGNOSIS — R7309 Other abnormal glucose: Secondary | ICD-10-CM | POA: Diagnosis not present

## 2022-10-18 DIAGNOSIS — F5101 Primary insomnia: Secondary | ICD-10-CM | POA: Diagnosis not present

## 2022-10-18 DIAGNOSIS — B351 Tinea unguium: Secondary | ICD-10-CM | POA: Diagnosis not present

## 2022-10-18 DIAGNOSIS — R0789 Other chest pain: Secondary | ICD-10-CM | POA: Diagnosis not present

## 2022-10-18 DIAGNOSIS — Z0130 Encounter for examination of blood pressure without abnormal findings: Secondary | ICD-10-CM | POA: Diagnosis not present

## 2022-10-18 DIAGNOSIS — Z1389 Encounter for screening for other disorder: Secondary | ICD-10-CM | POA: Diagnosis not present

## 2022-10-18 DIAGNOSIS — Z23 Encounter for immunization: Secondary | ICD-10-CM | POA: Diagnosis not present

## 2023-01-02 ENCOUNTER — Other Ambulatory Visit: Payer: Self-pay

## 2023-01-02 ENCOUNTER — Encounter: Payer: Self-pay | Admitting: *Deleted

## 2023-01-02 ENCOUNTER — Ambulatory Visit: Payer: BC Managed Care – PPO

## 2023-01-02 ENCOUNTER — Ambulatory Visit
Admission: EM | Admit: 2023-01-02 | Discharge: 2023-01-02 | Disposition: A | Discharge status: Home / Self Care | Payer: BC Managed Care – PPO | Attending: Internal Medicine | Admitting: Internal Medicine

## 2023-01-02 DIAGNOSIS — M79672 Pain in left foot: Secondary | ICD-10-CM

## 2023-01-02 DIAGNOSIS — M25572 Pain in left ankle and joints of left foot: Secondary | ICD-10-CM | POA: Diagnosis not present

## 2023-01-02 NOTE — ED Provider Notes (Signed)
[facility]    CSN: [id number] Arrival date & time: 01/02/23  5621      History   Chief Complaint Chief Complaint  Patient presents with   Ankle Pain    HPI Tyrion Tornatore is a 22 y.o. male.   Patient here today for evaluation of left ankle pain that started a few days ago when he accidentally rolled his ankle twice.  He denies any numbness or tingling.  The history is provided by the patient.    Past Medical History:  Diagnosis Date   Hypertension    Pre-diabetes    Prediabetes     There are no problems to display for this patient.   History reviewed. No pertinent surgical history.     Home Medications    Prior to Admission medications   Medication Sig Start Date End Date Taking? Authorizing Provider  fexofenadine (ALLEGRA) 180 MG tablet Take 1 tablet (180 mg total) by mouth daily. 07/11/21 01/07/22  Theadora Rama Scales, PA-C  fluticasone (FLONASE) 50 MCG/ACT nasal spray Place 1 spray into both nostrils daily. Begin by using 2 sprays in each nare daily for 3 to 5 days, then decrease to 1 spray in each nare daily. 07/11/21   Theadora Rama Scales, PA-C  ibuprofen (ADVIL) 600 MG tablet Take 1 tablet (600 mg total) by mouth every 8 (eight) hours as needed for up to 30 doses for fever, headache, mild pain or moderate pain (Inflammation). Take 1 tablet 3 times daily as needed for inflammation of upper airways and/or pain. 07/11/21   Theadora Rama Scales, PA-C    Family History Family History  Problem Relation Age of Onset   Healthy Mother     Social History Social History   Tobacco Use   Smoking status: Never   Smokeless tobacco: Never  Vaping Use   Vaping status: Never Used  Substance Use Topics   Alcohol use: Yes   Drug use: Never     Allergies   Patient has no known allergies.   Review of Systems Review of Systems  Constitutional:  Negative for chills and fever.  Eyes:  Negative for discharge and redness.   Respiratory:  Negative for shortness of breath.   Musculoskeletal:  Positive for arthralgias and joint swelling.  Neurological:  Negative for numbness.     Physical Exam Triage Vital Signs ED Triage Vitals  Encounter Vitals Group     BP      Systolic BP Percentile      Diastolic BP Percentile      Pulse      Resp      Temp      Temp src      SpO2      Weight      Height      Head Circumference      Peak Flow      Pain Score      Pain Loc      Pain Education      Exclude from Growth Chart    No data found.  Updated Vital Signs BP 122/76 (BP Location: Left Arm)   Pulse (!) 59   Temp 98.1 F (36.7 C) (Oral)   Resp 18   SpO2 100%   Visual Acuity Right Eye Distance:   Left Eye Distance:   Bilateral Distance:    Right Eye Near:   Left Eye Near:    Bilateral Near:     Physical Exam Vitals and nursing note reviewed.  Constitutional:      General: He is not in acute distress.    Appearance: Normal appearance. He is not ill-appearing.  HENT:     Head: Normocephalic and atraumatic.  Eyes:     Conjunctiva/sclera: Conjunctivae normal.  Cardiovascular:     Rate and Rhythm: Normal rate.  Pulmonary:     Effort: Pulmonary effort is normal. No respiratory distress.  Musculoskeletal:     Comments: Mild swelling of left ankle with decreased range of motion due to pain  Skin:    Capillary Refill: Normal cap refill to left toes Neurological:     Mental Status: He is alert.     Comments: Gross incision intact to left toes distally  Psychiatric:        Mood and Affect: Mood normal.        Behavior: Behavior normal.        Thought Content: Thought content normal.      UC Treatments / Results  Labs (all labs ordered are listed, but only abnormal results are displayed) Labs Reviewed - No data to display  EKG   Radiology No results found.  Procedures Procedures (including critical care time)  Medications Ordered in UC Medications - No data to  display  Initial Impression / Assessment and Plan / UC Course  I have reviewed the triage vital signs and the nursing notes.  Pertinent labs & imaging results that were available during my care of the patient were reviewed by me and considered in my medical decision making (see chart for details).     Will treat to cover ankle sprain given negative x-ray.  Recommended RICE therapy and follow-up if no gradual improvement with any further concerns.  Advised evaluation by Ortho if any worsening or persistent symptoms occur.   Final Clinical Impressions(s) / UC Diagnoses   Final diagnoses:  Left foot pain   Discharge Instructions   None    ED Prescriptions   None    PDMP not reviewed this encounter.   Tomi Bamberger, PA-C 01/23/23 1342

## 2023-01-02 NOTE — ED Triage Notes (Signed)
Pt states he  "rolled his ankle twice" on Monday. C/o pain in left ankle extending into left foot

## 2023-01-23 DIAGNOSIS — E669 Obesity, unspecified: Secondary | ICD-10-CM | POA: Diagnosis not present

## 2023-01-23 DIAGNOSIS — R0789 Other chest pain: Secondary | ICD-10-CM | POA: Diagnosis not present

## 2023-01-23 DIAGNOSIS — Z23 Encounter for immunization: Secondary | ICD-10-CM | POA: Diagnosis not present

## 2023-01-23 DIAGNOSIS — F411 Generalized anxiety disorder: Secondary | ICD-10-CM | POA: Diagnosis not present

## 2023-01-23 DIAGNOSIS — R5383 Other fatigue: Secondary | ICD-10-CM | POA: Diagnosis not present

## 2023-01-23 DIAGNOSIS — R7309 Other abnormal glucose: Secondary | ICD-10-CM | POA: Diagnosis not present

## 2023-01-23 DIAGNOSIS — Z1389 Encounter for screening for other disorder: Secondary | ICD-10-CM | POA: Diagnosis not present

## 2023-01-23 DIAGNOSIS — Z8241 Family history of sudden cardiac death: Secondary | ICD-10-CM | POA: Diagnosis not present

## 2023-01-23 DIAGNOSIS — Z0130 Encounter for examination of blood pressure without abnormal findings: Secondary | ICD-10-CM | POA: Diagnosis not present

## 2023-01-23 DIAGNOSIS — Z1331 Encounter for screening for depression: Secondary | ICD-10-CM | POA: Diagnosis not present

## 2023-01-23 DIAGNOSIS — Z0131 Encounter for examination of blood pressure with abnormal findings: Secondary | ICD-10-CM | POA: Diagnosis not present
